# Patient Record
Sex: Male | Born: 1965 | Race: White | Hispanic: No | Marital: Married | Smoking: Current every day smoker
Health system: Southern US, Community
[De-identification: ages and names within clinical notes are randomized; demographics above are authoritative.]

## PROBLEM LIST (undated history)

## (undated) DIAGNOSIS — R140 Abdominal distension (gaseous): Secondary | ICD-10-CM

## (undated) DIAGNOSIS — F419 Anxiety disorder, unspecified: Secondary | ICD-10-CM

## (undated) DIAGNOSIS — G8220 Paraplegia, unspecified: Secondary | ICD-10-CM

## (undated) DIAGNOSIS — R6881 Early satiety: Secondary | ICD-10-CM

## (undated) HISTORY — DX: Paraplegia, unspecified: G82.20

## (undated) HISTORY — DX: Anxiety disorder, unspecified: F41.9

---

## 2009-05-27 DIAGNOSIS — G8220 Paraplegia, unspecified: Secondary | ICD-10-CM

## 2009-05-27 HISTORY — DX: Paraplegia, unspecified: G82.20

## 2009-05-27 HISTORY — PX: OTHER SURGICAL HISTORY: SHX169

## 2010-05-27 HISTORY — PX: OTHER SURGICAL HISTORY: SHX169

## 2013-03-23 ENCOUNTER — Emergency Department: Payer: Self-pay | Admitting: Urology

## 2013-03-23 LAB — URINALYSIS, COMPLETE
Blood: NEGATIVE
Glucose,UR: NEGATIVE mg/dL (ref 0–75)
Nitrite: NEGATIVE
Ph: 9 (ref 4.5–8.0)
Protein: NEGATIVE
RBC,UR: 18 /HPF (ref 0–5)

## 2013-03-23 LAB — COMPREHENSIVE METABOLIC PANEL
Albumin: 3.1 g/dL — ABNORMAL LOW (ref 3.4–5.0)
Alkaline Phosphatase: 72 U/L (ref 50–136)
Anion Gap: 3 — ABNORMAL LOW (ref 7–16)
BUN: 6 mg/dL — ABNORMAL LOW (ref 7–18)
Co2: 28 mmol/L (ref 21–32)
Creatinine: 0.82 mg/dL (ref 0.60–1.30)
Glucose: 90 mg/dL (ref 65–99)
Osmolality: 271 (ref 275–301)
SGPT (ALT): 24 U/L (ref 12–78)
Sodium: 137 mmol/L (ref 136–145)
Total Protein: 6.6 g/dL (ref 6.4–8.2)

## 2013-03-23 LAB — CBC
HCT: 41.3 % (ref 40.0–52.0)
HGB: 14 g/dL (ref 13.0–18.0)
MCH: 26.6 pg (ref 26.0–34.0)

## 2013-03-23 LAB — LIPASE, BLOOD: Lipase: 176 U/L (ref 73–393)

## 2013-03-27 LAB — URINE CULTURE

## 2013-06-03 ENCOUNTER — Ambulatory Visit: Payer: Self-pay | Admitting: Gastroenterology

## 2014-03-18 ENCOUNTER — Inpatient Hospital Stay: Payer: Self-pay | Admitting: Internal Medicine

## 2014-03-18 LAB — COMPREHENSIVE METABOLIC PANEL
ALK PHOS: 84 U/L
ALT: 38 U/L
Albumin: 3.3 g/dL — ABNORMAL LOW (ref 3.4–5.0)
Anion Gap: 8 (ref 7–16)
BILIRUBIN TOTAL: 0.2 mg/dL (ref 0.2–1.0)
BUN: 8 mg/dL (ref 7–18)
CALCIUM: 8 mg/dL — AB (ref 8.5–10.1)
Chloride: 88 mmol/L — ABNORMAL LOW (ref 98–107)
Co2: 25 mmol/L (ref 21–32)
Creatinine: 0.64 mg/dL (ref 0.60–1.30)
EGFR (African American): >60
EGFR (Non-African Amer.): >60
Glucose: 86 mg/dL (ref 65–99)
Osmolality: 242 (ref 275–301)
Potassium: 4.2 mmol/L (ref 3.5–5.1)
SGOT(AST): 26 U/L (ref 15–37)
Sodium: 121 mmol/L — ABNORMAL LOW (ref 136–145)
Total Protein: 6.1 g/dL — ABNORMAL LOW (ref 6.4–8.2)

## 2014-03-18 LAB — CBC
HCT: 40.2 % (ref 40.0–52.0)
HGB: 13.1 g/dL (ref 13.0–18.0)
MCH: 26 pg (ref 26.0–34.0)
MCHC: 32.5 g/dL (ref 32.0–36.0)
MCV: 80 fL (ref 80–100)
Platelet: 225 10*3/uL (ref 150–440)
RBC: 5.04 10*6/uL (ref 4.40–5.90)
RDW: 15.2 % — AB (ref 11.5–14.5)
WBC: 8.6 10*3/uL (ref 3.8–10.6)

## 2014-03-18 LAB — URINALYSIS, COMPLETE
BILIRUBIN, UR: NEGATIVE
Blood: NEGATIVE
Glucose,UR: NEGATIVE mg/dL (ref 0–75)
Ketone: NEGATIVE
Nitrite: NEGATIVE
PH: 7 (ref 4.5–8.0)
Protein: NEGATIVE
RBC,UR: 3 /HPF (ref 0–5)
Specific Gravity: 1.005 (ref 1.003–1.030)
WBC UR: 15 /HPF (ref 0–5)

## 2014-03-18 LAB — ETHANOL: Ethanol: <3 mg/dL (ref 0–80)

## 2014-03-18 LAB — DRUG SCREEN, URINE
AMPHETAMINES, UR SCREEN: NEGATIVE (ref ?–1000)
BARBITURATES, UR SCREEN: NEGATIVE (ref ?–200)
Benzodiazepine, Ur Scrn: NEGATIVE (ref ?–200)
COCAINE METABOLITE, UR ~~LOC~~: NEGATIVE (ref ?–300)
Cannabinoid 50 Ng, Ur ~~LOC~~: NEGATIVE (ref ?–50)
MDMA (Ecstasy)Ur Screen: NEGATIVE (ref ?–500)
METHADONE, UR SCREEN: POSITIVE (ref ?–300)
OPIATE, UR SCREEN: NEGATIVE (ref ?–300)
Phencyclidine (PCP) Ur S: NEGATIVE (ref ?–25)
Tricyclic, Ur Screen: NEGATIVE (ref ?–1000)

## 2014-03-18 LAB — SALICYLATE LEVEL: Salicylates, Serum: 2.2 mg/dL

## 2014-03-18 LAB — ACETAMINOPHEN LEVEL: ACETAMINOPHEN: 3 ug/mL — AB

## 2014-03-19 LAB — BASIC METABOLIC PANEL
ANION GAP: 7 (ref 7–16)
BUN: 6 mg/dL — ABNORMAL LOW (ref 7–18)
CHLORIDE: 98 mmol/L (ref 98–107)
Calcium, Total: 8 mg/dL — ABNORMAL LOW (ref 8.5–10.1)
Co2: 26 mmol/L (ref 21–32)
Creatinine: 0.61 mg/dL (ref 0.60–1.30)
EGFR (African American): >60
EGFR (Non-African Amer.): >60
Glucose: 77 mg/dL (ref 65–99)
Osmolality: 259 (ref 275–301)
Potassium: 4.3 mmol/L (ref 3.5–5.1)
Sodium: 131 mmol/L — ABNORMAL LOW (ref 136–145)

## 2014-03-20 LAB — SODIUM: Sodium: 133 mmol/L — ABNORMAL LOW (ref 136–145)

## 2014-03-21 LAB — URINE CULTURE

## 2014-03-29 ENCOUNTER — Inpatient Hospital Stay: Payer: Self-pay | Admitting: Internal Medicine

## 2014-03-29 LAB — COMPREHENSIVE METABOLIC PANEL
ALBUMIN: 3.1 g/dL — AB (ref 3.4–5.0)
ANION GAP: 9 (ref 7–16)
Alkaline Phosphatase: 80 U/L
BILIRUBIN TOTAL: 0.3 mg/dL (ref 0.2–1.0)
BUN: 8 mg/dL (ref 7–18)
CO2: 24 mmol/L (ref 21–32)
CREATININE: 0.6 mg/dL (ref 0.60–1.30)
Calcium, Total: 7.6 mg/dL — ABNORMAL LOW (ref 8.5–10.1)
Chloride: 86 mmol/L — ABNORMAL LOW (ref 98–107)
EGFR (African American): >60
EGFR (Non-African Amer.): >60
Glucose: 78 mg/dL (ref 65–99)
OSMOLALITY: 238 (ref 275–301)
Potassium: 4.5 mmol/L (ref 3.5–5.1)
SGOT(AST): 30 U/L (ref 15–37)
SGPT (ALT): 35 U/L
Sodium: 119 mmol/L — CL (ref 136–145)
TOTAL PROTEIN: 6 g/dL — AB (ref 6.4–8.2)

## 2014-03-29 LAB — URINALYSIS, COMPLETE
Bilirubin,UR: NEGATIVE
Blood: NEGATIVE
Glucose,UR: NEGATIVE mg/dL (ref 0–75)
Ketone: NEGATIVE
Nitrite: POSITIVE
Ph: 7 (ref 4.5–8.0)
Protein: NEGATIVE
SPECIFIC GRAVITY: 1.009 (ref 1.003–1.030)
Squamous Epithelial: 1
WBC UR: 126 /HPF (ref 0–5)

## 2014-03-29 LAB — SALICYLATE LEVEL: SALICYLATES, SERUM: 1.8 mg/dL

## 2014-03-29 LAB — CBC
HCT: 37.6 % — ABNORMAL LOW (ref 40.0–52.0)
HGB: 12.9 g/dL — ABNORMAL LOW (ref 13.0–18.0)
MCH: 26.9 pg (ref 26.0–34.0)
MCHC: 34.2 g/dL (ref 32.0–36.0)
MCV: 79 fL — ABNORMAL LOW (ref 80–100)
Platelet: 241 10*3/uL (ref 150–440)
RBC: 4.79 10*6/uL (ref 4.40–5.90)
RDW: 15.1 % — ABNORMAL HIGH (ref 11.5–14.5)
WBC: 9 10*3/uL (ref 3.8–10.6)

## 2014-03-29 LAB — ACETAMINOPHEN LEVEL: Acetaminophen: <2 ug/mL

## 2014-03-29 LAB — SODIUM
SODIUM: 119 mmol/L — AB (ref 136–145)
Sodium: 118 mmol/L — CL (ref 136–145)

## 2014-03-29 LAB — DRUG SCREEN, URINE
Amphetamines, Ur Screen: NEGATIVE (ref ?–1000)
BENZODIAZEPINE, UR SCRN: NEGATIVE (ref ?–200)
Barbiturates, Ur Screen: NEGATIVE (ref ?–200)
CANNABINOID 50 NG, UR ~~LOC~~: NEGATIVE (ref ?–50)
COCAINE METABOLITE, UR ~~LOC~~: NEGATIVE (ref ?–300)
MDMA (ECSTASY) UR SCREEN: NEGATIVE (ref ?–500)
METHADONE, UR SCREEN: POSITIVE (ref ?–300)
Opiate, Ur Screen: POSITIVE (ref ?–300)
Phencyclidine (PCP) Ur S: NEGATIVE (ref ?–25)
Tricyclic, Ur Screen: NEGATIVE (ref ?–1000)

## 2014-03-29 LAB — TROPONIN I
Troponin-I: <0.02 ng/mL
Troponin-I: <0.02 ng/mL

## 2014-03-29 LAB — TSH: THYROID STIMULATING HORM: 0.767 u[IU]/mL

## 2014-03-29 LAB — ETHANOL: Ethanol: <3 mg/dL

## 2014-03-29 LAB — OSMOLALITY, URINE: OSMOLALITY: 207 mosm/kg

## 2014-03-29 LAB — OSMOLALITY: Osmolality: 240 mOsm/kg — CL (ref 275–295)

## 2014-03-30 LAB — COMPREHENSIVE METABOLIC PANEL
ALBUMIN: 3.7 g/dL (ref 3.4–5.0)
ALK PHOS: 91 U/L
ANION GAP: 11 (ref 7–16)
BUN: 7 mg/dL (ref 7–18)
Bilirubin,Total: 0.4 mg/dL (ref 0.2–1.0)
CALCIUM: 8.6 mg/dL (ref 8.5–10.1)
CO2: 20 mmol/L — AB (ref 21–32)
CREATININE: 0.66 mg/dL (ref 0.60–1.30)
Chloride: 88 mmol/L — ABNORMAL LOW (ref 98–107)
Glucose: 90 mg/dL (ref 65–99)
Osmolality: 238 (ref 275–301)
Potassium: 4 mmol/L (ref 3.5–5.1)
SGOT(AST): 32 U/L (ref 15–37)
SGPT (ALT): 41 U/L
Sodium: 119 mmol/L — CL (ref 136–145)
Total Protein: 7.3 g/dL (ref 6.4–8.2)

## 2014-03-30 LAB — SODIUM
SODIUM: 120 mmol/L — AB (ref 136–145)
SODIUM: 122 mmol/L — AB (ref 136–145)
Sodium: 119 mmol/L — CL (ref 136–145)
Sodium: 122 mmol/L — ABNORMAL LOW (ref 136–145)

## 2014-03-30 LAB — CBC WITH DIFFERENTIAL/PLATELET
Basophil #: 0.4 10*3/uL — ABNORMAL HIGH (ref 0.0–0.1)
Basophil %: 3.3 %
EOS ABS: 0.1 10*3/uL (ref 0.0–0.7)
EOS PCT: 0.9 %
HCT: 44.8 % (ref 40.0–52.0)
HGB: 14.9 g/dL (ref 13.0–18.0)
LYMPHS PCT: 16.4 %
Lymphocyte #: 2.1 10*3/uL (ref 1.0–3.6)
MCH: 26.4 pg (ref 26.0–34.0)
MCHC: 33.3 g/dL (ref 32.0–36.0)
MCV: 79 fL — ABNORMAL LOW (ref 80–100)
MONO ABS: 0.9 x10 3/mm (ref 0.2–1.0)
Monocyte %: 7.1 %
NEUTROS PCT: 72.3 %
Neutrophil #: 9.5 10*3/uL — ABNORMAL HIGH (ref 1.4–6.5)
Platelet: 256 10*3/uL (ref 150–440)
RBC: 5.66 10*6/uL (ref 4.40–5.90)
RDW: 14.8 % — AB (ref 11.5–14.5)
WBC: 13.1 10*3/uL — ABNORMAL HIGH (ref 3.8–10.6)

## 2014-03-31 LAB — SODIUM
SODIUM: 123 mmol/L — AB (ref 136–145)
Sodium: 124 mmol/L — ABNORMAL LOW (ref 136–145)

## 2014-03-31 LAB — BASIC METABOLIC PANEL
ANION GAP: 11 (ref 7–16)
BUN: 11 mg/dL (ref 7–18)
CHLORIDE: 91 mmol/L — AB (ref 98–107)
Calcium, Total: 8.7 mg/dL (ref 8.5–10.1)
Co2: 22 mmol/L (ref 21–32)
Creatinine: 0.92 mg/dL (ref 0.60–1.30)
EGFR (Non-African Amer.): >60
GLUCOSE: 107 mg/dL — AB (ref 65–99)
Osmolality: 250 (ref 275–301)
Potassium: 3.8 mmol/L (ref 3.5–5.1)
SODIUM: 124 mmol/L — AB (ref 136–145)

## 2014-04-02 LAB — URINE CULTURE
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
## 2014-09-17 NOTE — Consult Note (Signed)
Psychiatry: PAtient seen and chart reviewed and case discussed with Dr Juliene PinaMody. Patient is now denying and suicidal ideation and is clear about having no desire to harm self. Showing no suicidal behavior here and appears invested in his future/ No sign psychosis. Patient no longer meets criteria for IVC and can be taken off commitment and needs no specific psych follow up for this adjustment disorder.  Electronic Signatures: Nikesha Kwasny, Jackquline DenmarkJohn T (MD)  (Signed on 05-Nov-15 11:54)  Authored  Last Updated: 05-Nov-15 11:54 by Audery Amellapacs, Rosio Weiss T (MD)

## 2014-09-17 NOTE — Discharge Summary (Signed)
PATIENT NAME:  Johnny Cunningham, Johnny Cunningham MR#:  [id number] DATE OF BIRTH:  1966/05/18  DATE OF ADMISSION:  03/29/2014 DATE OF DISCHARGE:  03/31/2014  ADMISSION DIAGNOSES: 1. Hyponatremia.  2. Suicidal ideation.   DISCHARGE DIAGNOSES:  1. 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  2. Suicidal ideation.  3. Chronic pain with cervical spinal injury.   CONSULTATIONS:  1. Pain management.  2. Dr. Toni Amend.  3. Dr. Wynelle Link from nephrology.   LABORATORY DATA:  Discharge sodium was 124.   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 with suicidal ideation. At the time of admission, he was noted to have hyponatremia and was asymptomatic. For further details, please refer to the history and physical.   1. Hyponatremia. The patient is asymptomatic, but had a sodium of 119 initially. He was placed on IV fluids. This actually did not help the sodium. The sodium level stayed the same. 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 We held his citalopram and trazodone, which can also cause hyponatremia. He was placed on a fluid restriction, and Dr. Wynelle Link did see the patient in consultation and recommended a fluid restriction with a repeat sodium level in 1 to 2 days.  2. Suicidal ideation. The patient expressed suicidality, so therefore the patient was placed with a sitter and psychiatry was consulted. The patient denies suicidal ideations. He said this was due to his pain. Psychiatry consult was appreciated. He is not suicidal and has consented for safety.  3. Chronic pain with cervical spine injury. Appreciate pain management consult. The patient will continue on his outpatient medications and follow up with his pain clinic as an outpatient.   DISCHARGE MEDICATIONS:  1. Potassium chloride 20 mEq b.i.d.  2. Loratadine 10 mg daily.  3. Zinc sulfate 220 mg daily. 4. Vitamin C 500 mg b.i.d.   5. Methadone 10 mg t.i.d.  6. Ambien 10 mg at bedtime. 7. Simethicone 4 times a day.  8. Polyethylene glycol 17 grams p.r.n.  9. Gabapentin 300 mg t.i.d.  10. Destin 40% topically to groin and buttocks 4 times a day.  11. Tylenol 325 q.4 h. p.r.n.  12. Cal-Gest 500 mg t.i.d. p.r.n.  13. Oxcarbazepine 600 mg b.i.d.  14. Bisacodyl 2 tablets at bedtime p.r.n.  15. Senexon 1 tablet b.i.d.  16. Baclofen 20 mg q.6 h.  17. Fluticasone 2 sprays daily.  18. Refresh liquid gel 1 drop to each eye 4 times a day p.r.n.  19. Pantoprazole 40 mg daily.  20. Oxybutynin 10 mg daily.  21. Tab-A-Vite multivitamin 1 tablet daily.  22. acetaminophen/oxycodone 325/10 q.8 h. p.r.n. pain.  23. Nicotine patch 21 mg per 24 hours. 24. Clonazepam 1 mg 3 tablets at bedtime.  25. Oxycodone 15 mg 4 times a day.  26. Fleet glycerin suppositories ( 2 suppositories daily.  27. Ciprofloxacin 500 p.o. b.i.d. x 7 days.  28. Topical A and D q.6 h. PRN 29. Vaseline 1 application q.6 h. PRN  The patient will stop taking citalopram and trazodone.   DISCHARGE DIET: Low sodium, fluid restriction 1.2 liters daily.   DISCHARGE ACTIVITY: As tolerated.   DISCHARGE FOLLOW-UP: The patient will need to follow up with Dr. Wynelle Link in 1 to 2 days. A sodium level should be checked tomorrow, 04/01/2014, and fax to Dr. Alice Rieger office. The patient may apply Vaseline and A and D as prescribed. The patient may have a cold pack  placed to the leg as needed.   PHYSICAL EXAMINATION ON DISCHARGE:  VITAL SIGNS: Temperature 97.8, pulse 60, respirations 18, blood pressure 130/81, 96% on room air.  GENERAL: The patient is alert, oriented, not in acute distress.  HEENT: Head is atraumatic. Pupils are round and reactive. Oropharynx is clear.  CARDIOVASCULAR: Regular rate and rhythm. No murmurs, gallops or rubs. PMI is hard to palpate due to body habitus.  LUNGS: Clear to auscultation without crackles,rales or wheezing. anteriorly normal chest  expansion ABDOMEN: Obese, bowel sounds are positive and nontender. Hard to appreciate organomegaly due to body habitus. He has a urostomy placed.  EXTREMITIES: No clubbing, cyanosis or edema. The patient is paralyzed in the lower extremities and unable to move his lower extremities.   DISCHARGE CONDITION: The patient is stable for discharge.   TIME SPENT: 40 minutes.    ____________________________ Janyth ContesSital P. Juliene PinaMody, MD spm:JT D: 03/31/2014 12:38:48 ET T: 03/31/2014 13:16:00 ET JOB#: [id number]  cc: Keirsten Matuska P. Juliene PinaMody, MD, <Dictator> Janyth ContesSITAL P Marea Reasner MD ELECTRONICALLY SIGNED 03/31/2014 16:51
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
## 2014-09-17 NOTE — Discharge Summary (Signed)
PATIENT NAME:  Johnny Cunningham, Johnny Cunningham MR#:  [id number] DATE OF BIRTH:  1965/12/19  DATE OF ADMISSION:  03/18/2014 DATE OF DISCHARGE:  03/21/2014   ADDENDUM:   This is an addendum to the discharge dictated 03/19/2014. The patient over the weekend remained relatively stable and calm. No more suicidal threats were given. He is medically stable to be discharged back [facility]. I spoke with the patient's outpatient psychiatrist and social worker, and [facility] is aware of the [facility]'s returning [facility].   The patient urine culture grew Klebsiella and Escherichia coli. We will keep cover him with antibiotics, Keflex 500 t.i.d., for 7 days, given the patient's having a urostomy tube. He did not have any fever or any elevated white count.  The case was discussed with Dr. Toni Amendlapacs about the patient's being discharged to [facility], and he is okay with this.    ____________________________ Wylie HailSona A. Allena KatzPatel, MD sap:MT D: 03/21/2014 08:09:00 ET T: 03/21/2014 12:55:33 ET JOB#: [id number]  cc: Amariya Liskey A. Allena KatzPatel, MD, <Dictator> Willow OraSONA A Emalina Dubreuil MD ELECTRONICALLY SIGNED 04/07/2014 7:40

## 2015-12-13 ENCOUNTER — Ambulatory Visit
Admission: RE | Admit: 2015-12-13 | Discharge: 2015-12-13 | Disposition: A | Discharge status: Home / Self Care | Payer: Medicare Other | Source: Ambulatory Visit | Attending: Student | Admitting: Student

## 2015-12-13 ENCOUNTER — Other Ambulatory Visit: Payer: Self-pay | Admitting: Student

## 2015-12-13 DIAGNOSIS — K5900 Constipation, unspecified: Secondary | ICD-10-CM | POA: Insufficient documentation

## 2015-12-13 DIAGNOSIS — R140 Abdominal distension (gaseous): Secondary | ICD-10-CM | POA: Insufficient documentation

## 2015-12-26 ENCOUNTER — Other Ambulatory Visit: Payer: Self-pay | Admitting: Student

## 2015-12-26 DIAGNOSIS — R140 Abdominal distension (gaseous): Secondary | ICD-10-CM

## 2015-12-26 DIAGNOSIS — R6881 Early satiety: Secondary | ICD-10-CM

## 2016-01-08 ENCOUNTER — Ambulatory Visit
Admission: RE | Admit: 2016-01-08 | Discharge: 2016-01-08 | Disposition: A | Discharge status: Home / Self Care | Payer: Medicare Other | Source: Ambulatory Visit | Attending: Student | Admitting: Student

## 2016-01-08 DIAGNOSIS — K30 Functional dyspepsia: Secondary | ICD-10-CM | POA: Diagnosis not present

## 2016-01-08 DIAGNOSIS — R6881 Early satiety: Secondary | ICD-10-CM | POA: Diagnosis present

## 2016-01-08 DIAGNOSIS — R140 Abdominal distension (gaseous): Secondary | ICD-10-CM | POA: Diagnosis present

## 2016-01-08 HISTORY — DX: Early satiety: R68.81

## 2016-01-08 HISTORY — DX: Abdominal distension (gaseous): R14.0

## 2016-01-08 MED ORDER — TECHNETIUM TC 99M SULFUR COLLOID
1.5960 | Freq: Once | INTRAVENOUS | Status: AC | PRN
  Administered 2016-01-08: 1.596 via INTRAVENOUS

## 2016-02-27 ENCOUNTER — Ambulatory Visit: Payer: Medicare Other | Attending: Student | Admitting: Physical Therapy

## 2016-02-27 ENCOUNTER — Encounter: Payer: Self-pay | Admitting: Physical Therapy

## 2016-02-27 DIAGNOSIS — R278 Other lack of coordination: Secondary | ICD-10-CM | POA: Diagnosis present

## 2016-02-27 DIAGNOSIS — G8222 Paraplegia, incomplete: Secondary | ICD-10-CM | POA: Diagnosis present

## 2016-02-27 DIAGNOSIS — R2689 Other abnormalities of gait and mobility: Secondary | ICD-10-CM | POA: Diagnosis present

## 2016-02-27 NOTE — Patient Instructions (Signed)
Morning:  Ribcage breathing 5 reps  Inhale thru the nose 1-2-3 pause, exhale 3- 2-1 pause  You are now ready to begin training the deep core muscles system: diaphragm, transverse abdominis, pelvic floor . These muscles must work together as a team.      The key to these exercises to train the brain to coordinate the timing of these muscles and to have them turn on for long periods of time to hold you upright against gravity (especially important if you are on your feet all day).These muscles are postural muscles and play a role stabilizing your spine and bodyweight. By doing these repetitions slowly and correctly instead of doing crunches, you will achieve a flatter belly without a lower pooch. You are also placing your spine in a more neutral position and breathing properly which in turn, decreases your risk for problems related to your pelvic floor, abdominal, and low back such as pelvic organ prolapse, hernias, diastasis recti (separation of superficial muscles), disk herniations, spinal fractures. These exercises set a solid foundation for you to later progress to resistance/ strength training with therabands and weights and return to other typical fitness exercises with a stronger deeper core.   _______   Stretches for ribcage: Breathing:  3 reps arms overhead 3 reps of bending to the side 3 reps bending over and high five the sky for trunk rotation  _______  Breathing again 5 reps , feel more lateral excursion  _______  Colon./ lymph massage (gentle like paws of cat) smiley face half circles (dome is in the direction of your head)   Start at R side and then move to upper left and lower left    5 reps   _______  Breathing again 5 reps , feel more lateral excursion

## 2016-02-28 ENCOUNTER — Encounter: Payer: Self-pay | Admitting: Physical Therapy
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
## 2016-02-28 NOTE — Addendum Note (Signed)
Addended by: Mariane MastersYEUNG, SHIN-YIING on: 02/28/2016 10:26 PM   Modules accepted: Orders
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
## 2016-03-01 ENCOUNTER — Telehealth: Payer: Self-pay | Admitting: Physical Therapy

## 2016-03-01 NOTE — Telephone Encounter (Signed)
PT left message on pt's cell phone after his mom left a message with front desk staff stating that he was not feeling well.  PT stated to discontinue HEP if exercises caused more pain and they will be modified at next session. HEP can also be decreased in frequency if needed. PT provided direct line and email if pt had any questions.

## 2016-03-12 ENCOUNTER — Encounter: Payer: Medicare Other | Admitting: Physical Therapy

## 2016-03-26 ENCOUNTER — Encounter: Payer: Medicare Other | Admitting: Physical Therapy

## 2016-04-02 ENCOUNTER — Ambulatory Visit: Payer: Medicare Other | Attending: Student | Admitting: Physical Therapy

## 2016-04-02 DIAGNOSIS — G8222 Paraplegia, incomplete: Secondary | ICD-10-CM | POA: Diagnosis not present

## 2016-04-02 DIAGNOSIS — R2689 Other abnormalities of gait and mobility: Secondary | ICD-10-CM | POA: Insufficient documentation

## 2016-04-02 DIAGNOSIS — R278 Other lack of coordination: Secondary | ICD-10-CM | POA: Diagnosis present

## 2016-04-02 NOTE — Patient Instructions (Signed)
Provided copy of "I Still Hurt" by Duane LopeAdrian Louw to help pt understand the importance of retraining the brain for improved pain management  Body scan practice daily. 3 cycles  With warm and cool down   Apps: mindfulness:   headspace  Insight timer   Research articles printed on body scan.
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
## 2016-04-16 ENCOUNTER — Encounter: Payer: Medicare Other | Admitting: Physical Therapy

## 2016-04-30 ENCOUNTER — Encounter: Payer: Medicare Other | Admitting: Physical Therapy

## 2018-05-22 IMAGING — NM NM GASTRIC EMPTYING
1 series · 10 of 10 positions shown · non-contrast
Comparison: Supine abdominal radiograph dated December 13, 2015

CLINICAL DATA: Early satiety, bloating, no vomiting or nausea

EXAM:
NUCLEAR MEDICINE GASTRIC EMPTYING SCAN
TECHNIQUE: After oral ingestion of radiolabeled meal, sequential abdominal
images were obtained for 4 hours. Percentage of activity emptying
the stomach was calculated at 1 hour, 2 hour, 3 hour, and 4 hours.
RADIOPHARMACEUTICALS:  1.596 mCi 4c-IIm MDP labeled sulfur colloid
orally

[Series 1000: gatric statics (results) · 3.90mm/px · 5 acquisitions, 10 frames shown]
[im 1/5]
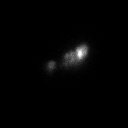
[im 1/5]
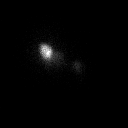
[im 2/5]
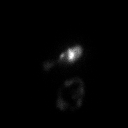
[im 2/5]
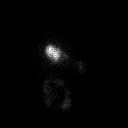
[im 3/5]
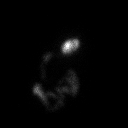
[im 3/5]
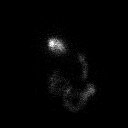
[im 4/5]
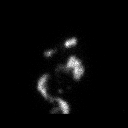
[im 4/5]
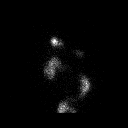
[im 5/5]
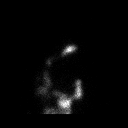
[im 5/5]
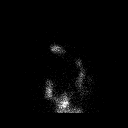

[10 of 10 positions shown; findings below may reference images not displayed]

FINDINGS: Expected location of the stomach in the left upper quadrant.
Ingested meal empties the stomach gradually over the course of the
study.

18% emptied at 1 hr ( normal >= 10%)

50% emptied at 2 hr ( normal >= 40%)

83% emptied at 3 hr ( normal >= 70%)

85% emptied at 4 hr ( normal >= 90%)
IMPRESSION: Minimally delayed gastric emptying value of 85% at 4 hours.

## 2022-12-26 DEATH — deceased
